# Patient Record
Sex: Male | Born: 1995
Health system: Southern US, Community
[De-identification: ages and names within clinical notes are randomized; demographics above are authoritative.]

## PROBLEM LIST (undated history)

## (undated) DIAGNOSIS — Z00129 Encounter for routine child health examination without abnormal findings: Secondary | ICD-10-CM

## (undated) DIAGNOSIS — F419 Anxiety disorder, unspecified: Secondary | ICD-10-CM

## (undated) DIAGNOSIS — Z003 Encounter for examination for adolescent development state: Secondary | ICD-10-CM

## (undated) HISTORY — PX: MOLE REMOVAL: SHX2046

## (undated) HISTORY — DX: Anxiety disorder, unspecified: F41.9

## (undated) HISTORY — DX: Encounter for examination for adolescent development state: Z00.3

## (undated) HISTORY — DX: Encounter for routine child health examination without abnormal findings: Z00.129

## (undated) HISTORY — PX: HARDWARE REMOVAL: SHX979

---
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
## 2016-03-02 DIAGNOSIS — L309 Dermatitis, unspecified: Secondary | ICD-10-CM | POA: Diagnosis not present

## 2017-02-26 ENCOUNTER — Encounter: Payer: Self-pay | Admitting: Family Medicine

## 2017-02-26 ENCOUNTER — Ambulatory Visit (INDEPENDENT_AMBULATORY_CARE_PROVIDER_SITE_OTHER): Payer: Federal, State, Local not specified - PPO | Admitting: Family Medicine

## 2017-02-26 VITALS — BP 110/84 | HR 80 | Temp 98.7°F | Ht 71.25 in | Wt 153.2 lb

## 2017-02-26 DIAGNOSIS — R17 Unspecified jaundice: Secondary | ICD-10-CM | POA: Diagnosis not present

## 2017-02-26 DIAGNOSIS — Z13220 Encounter for screening for lipoid disorders: Secondary | ICD-10-CM

## 2017-02-26 DIAGNOSIS — Z0000 Encounter for general adult medical examination without abnormal findings: Secondary | ICD-10-CM | POA: Diagnosis not present

## 2017-02-26 LAB — CBC
HEMATOCRIT: 46.9 % (ref 39.0–52.0)
HEMOGLOBIN: 16.2 g/dL (ref 13.0–17.0)
MCHC: 34.4 g/dL (ref 30.0–36.0)
MCV: 92.4 fl (ref 78.0–100.0)
PLATELETS: 284 10*3/uL (ref 150.0–400.0)
RBC: 5.08 Mil/uL (ref 4.22–5.81)
RDW: 13.7 % (ref 11.5–14.6)
WBC: 5.9 10*3/uL (ref 4.5–10.5)

## 2017-02-26 LAB — LIPID PANEL
CHOL/HDL RATIO: 2
CHOLESTEROL: 169 mg/dL (ref 0–200)
HDL: 70.3 mg/dL (ref >39.00)
LDL CALC: 76 mg/dL (ref 0–99)
NONHDL: 98.66
Triglycerides: 112 mg/dL (ref 0.0–149.0)
VLDL: 22.4 mg/dL (ref 0.0–40.0)

## 2017-02-26 LAB — COMPREHENSIVE METABOLIC PANEL
ALBUMIN: 4.8 g/dL (ref 3.5–5.2)
ALT: 28 U/L (ref 0–53)
AST: 29 U/L (ref 0–37)
Alkaline Phosphatase: 53 U/L (ref 39–117)
BUN: 8 mg/dL (ref 6–23)
CHLORIDE: 102 meq/L (ref 96–112)
CO2: 27 mEq/L (ref 19–32)
CREATININE: 0.74 mg/dL (ref 0.40–1.50)
Calcium: 9.8 mg/dL (ref 8.4–10.5)
GFR: 142.98 mL/min (ref >60.00)
Glucose, Bld: 86 mg/dL (ref 70–99)
POTASSIUM: 4.1 meq/L (ref 3.5–5.1)
SODIUM: 137 meq/L (ref 135–145)
Total Bilirubin: 1.1 mg/dL (ref 0.2–1.2)
Total Protein: 7.4 g/dL (ref 6.0–8.3)

## 2017-02-26 NOTE — Patient Instructions (Signed)
Please stop by lab before you go  No changes today  Would love no future concussions for your long term health
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
## 2017-08-06 HISTORY — PX: LEG SURGERY: SHX1003

## 2017-08-16 ENCOUNTER — Telehealth: Payer: Self-pay | Admitting: Family Medicine

## 2017-08-16 NOTE — Telephone Encounter (Signed)
Copied from CRM [id number]. Topic: General - Other >> Aug 16, 2017 12:49 PM [name], Stephen Martinez wrote: Reason for CRM: patient calling wanting someone to fax over his immunizations to [facility] fax number [phone] phone # [phone]

## 2017-08-16 NOTE — Telephone Encounter (Signed)
Pt notified copy of immunizations faxed to [employer] as requested. Pt verbalized understanding.

## 2017-08-16 NOTE — Telephone Encounter (Signed)
See note

## 2017-09-07 DIAGNOSIS — S82892A Other fracture of left lower leg, initial encounter for closed fracture: Secondary | ICD-10-CM | POA: Diagnosis not present

## 2017-09-07 DIAGNOSIS — W5529XA Other contact with cow, initial encounter: Secondary | ICD-10-CM | POA: Diagnosis not present

## 2017-09-07 DIAGNOSIS — S82402A Unspecified fracture of shaft of left fibula, initial encounter for closed fracture: Secondary | ICD-10-CM | POA: Diagnosis not present

## 2017-09-07 DIAGNOSIS — S82392A Other fracture of lower end of left tibia, initial encounter for closed fracture: Secondary | ICD-10-CM | POA: Diagnosis not present

## 2017-09-07 DIAGNOSIS — S82209A Unspecified fracture of shaft of unspecified tibia, initial encounter for closed fracture: Secondary | ICD-10-CM | POA: Insufficient documentation

## 2017-09-07 DIAGNOSIS — S82302A Unspecified fracture of lower end of left tibia, initial encounter for closed fracture: Secondary | ICD-10-CM | POA: Diagnosis not present

## 2017-09-07 DIAGNOSIS — S82442A Displaced spiral fracture of shaft of left fibula, initial encounter for closed fracture: Secondary | ICD-10-CM | POA: Diagnosis not present

## 2017-09-07 DIAGNOSIS — S82162A Torus fracture of upper end of left tibia, initial encounter for closed fracture: Secondary | ICD-10-CM | POA: Diagnosis not present

## 2017-09-07 DIAGNOSIS — S8992XA Unspecified injury of left lower leg, initial encounter: Secondary | ICD-10-CM | POA: Diagnosis not present

## 2017-09-07 DIAGNOSIS — Y999 Unspecified external cause status: Secondary | ICD-10-CM | POA: Diagnosis not present

## 2017-09-07 DIAGNOSIS — S82242A Displaced spiral fracture of shaft of left tibia, initial encounter for closed fracture: Secondary | ICD-10-CM | POA: Diagnosis not present

## 2017-09-08 DIAGNOSIS — S82432A Displaced oblique fracture of shaft of left fibula, initial encounter for closed fracture: Secondary | ICD-10-CM | POA: Diagnosis not present

## 2017-09-08 DIAGNOSIS — S82892A Other fracture of left lower leg, initial encounter for closed fracture: Secondary | ICD-10-CM | POA: Diagnosis not present

## 2017-09-08 DIAGNOSIS — S82392A Other fracture of lower end of left tibia, initial encounter for closed fracture: Secondary | ICD-10-CM | POA: Diagnosis not present

## 2017-09-08 DIAGNOSIS — S82832A Other fracture of upper and lower end of left fibula, initial encounter for closed fracture: Secondary | ICD-10-CM | POA: Diagnosis not present

## 2017-09-08 DIAGNOSIS — S82202A Unspecified fracture of shaft of left tibia, initial encounter for closed fracture: Secondary | ICD-10-CM | POA: Diagnosis not present

## 2017-09-08 DIAGNOSIS — S82242A Displaced spiral fracture of shaft of left tibia, initial encounter for closed fracture: Secondary | ICD-10-CM | POA: Diagnosis not present

## 2017-09-08 DIAGNOSIS — S82302A Unspecified fracture of lower end of left tibia, initial encounter for closed fracture: Secondary | ICD-10-CM | POA: Diagnosis not present

## 2017-09-11 DIAGNOSIS — R269 Unspecified abnormalities of gait and mobility: Secondary | ICD-10-CM | POA: Diagnosis not present

## 2017-09-11 DIAGNOSIS — M6281 Muscle weakness (generalized): Secondary | ICD-10-CM | POA: Diagnosis not present

## 2017-10-22 DIAGNOSIS — Z9889 Other specified postprocedural states: Secondary | ICD-10-CM | POA: Diagnosis not present

## 2017-10-22 DIAGNOSIS — X58XXXD Exposure to other specified factors, subsequent encounter: Secondary | ICD-10-CM | POA: Diagnosis not present

## 2017-10-22 DIAGNOSIS — S82242D Displaced spiral fracture of shaft of left tibia, subsequent encounter for closed fracture with routine healing: Secondary | ICD-10-CM | POA: Diagnosis not present

## 2017-10-29 DIAGNOSIS — X58XXXD Exposure to other specified factors, subsequent encounter: Secondary | ICD-10-CM | POA: Diagnosis not present

## 2017-10-29 DIAGNOSIS — R262 Difficulty in walking, not elsewhere classified: Secondary | ICD-10-CM | POA: Diagnosis not present

## 2017-10-29 DIAGNOSIS — S82242D Displaced spiral fracture of shaft of left tibia, subsequent encounter for closed fracture with routine healing: Secondary | ICD-10-CM | POA: Diagnosis not present

## 2017-11-08 DIAGNOSIS — M25672 Stiffness of left ankle, not elsewhere classified: Secondary | ICD-10-CM | POA: Diagnosis not present

## 2017-11-08 DIAGNOSIS — R531 Weakness: Secondary | ICD-10-CM | POA: Diagnosis not present

## 2017-11-08 DIAGNOSIS — R262 Difficulty in walking, not elsewhere classified: Secondary | ICD-10-CM | POA: Diagnosis not present

## 2017-11-11 DIAGNOSIS — R262 Difficulty in walking, not elsewhere classified: Secondary | ICD-10-CM | POA: Diagnosis not present

## 2017-11-11 DIAGNOSIS — R531 Weakness: Secondary | ICD-10-CM | POA: Diagnosis not present

## 2017-11-11 DIAGNOSIS — M25672 Stiffness of left ankle, not elsewhere classified: Secondary | ICD-10-CM | POA: Diagnosis not present

## 2017-11-13 DIAGNOSIS — R262 Difficulty in walking, not elsewhere classified: Secondary | ICD-10-CM | POA: Diagnosis not present

## 2017-11-13 DIAGNOSIS — R531 Weakness: Secondary | ICD-10-CM | POA: Diagnosis not present

## 2017-11-13 DIAGNOSIS — M25672 Stiffness of left ankle, not elsewhere classified: Secondary | ICD-10-CM | POA: Diagnosis not present

## 2017-11-26 DIAGNOSIS — Z967 Presence of other bone and tendon implants: Secondary | ICD-10-CM | POA: Diagnosis not present

## 2017-11-26 DIAGNOSIS — X58XXXD Exposure to other specified factors, subsequent encounter: Secondary | ICD-10-CM | POA: Diagnosis not present

## 2017-11-26 DIAGNOSIS — S8252XD Displaced fracture of medial malleolus of left tibia, subsequent encounter for closed fracture with routine healing: Secondary | ICD-10-CM | POA: Diagnosis not present

## 2017-11-26 DIAGNOSIS — Z4789 Encounter for other orthopedic aftercare: Secondary | ICD-10-CM | POA: Diagnosis not present

## 2017-11-26 DIAGNOSIS — S82242D Displaced spiral fracture of shaft of left tibia, subsequent encounter for closed fracture with routine healing: Secondary | ICD-10-CM | POA: Diagnosis not present

## 2017-12-16 DIAGNOSIS — L821 Other seborrheic keratosis: Secondary | ICD-10-CM | POA: Diagnosis not present

## 2017-12-16 DIAGNOSIS — L814 Other melanin hyperpigmentation: Secondary | ICD-10-CM | POA: Diagnosis not present

## 2017-12-16 DIAGNOSIS — D485 Neoplasm of uncertain behavior of skin: Secondary | ICD-10-CM | POA: Diagnosis not present

## 2017-12-16 DIAGNOSIS — L74510 Primary focal hyperhidrosis, axilla: Secondary | ICD-10-CM | POA: Diagnosis not present

## 2017-12-16 DIAGNOSIS — D225 Melanocytic nevi of trunk: Secondary | ICD-10-CM | POA: Diagnosis not present

## 2017-12-16 DIAGNOSIS — D229 Melanocytic nevi, unspecified: Secondary | ICD-10-CM | POA: Diagnosis not present

## 2018-03-04 ENCOUNTER — Encounter: Payer: Federal, State, Local not specified - PPO | Admitting: Family Medicine

## 2018-03-11 DIAGNOSIS — T8484XA Pain due to internal orthopedic prosthetic devices, implants and grafts, initial encounter: Secondary | ICD-10-CM | POA: Diagnosis not present

## 2018-03-11 DIAGNOSIS — T8484XD Pain due to internal orthopedic prosthetic devices, implants and grafts, subsequent encounter: Secondary | ICD-10-CM | POA: Diagnosis not present

## 2018-03-11 DIAGNOSIS — X58XXXD Exposure to other specified factors, subsequent encounter: Secondary | ICD-10-CM | POA: Diagnosis not present

## 2018-03-11 DIAGNOSIS — Z4789 Encounter for other orthopedic aftercare: Secondary | ICD-10-CM | POA: Diagnosis not present

## 2018-03-11 DIAGNOSIS — S82242D Displaced spiral fracture of shaft of left tibia, subsequent encounter for closed fracture with routine healing: Secondary | ICD-10-CM | POA: Diagnosis not present

## 2018-03-11 DIAGNOSIS — S82302D Unspecified fracture of lower end of left tibia, subsequent encounter for closed fracture with routine healing: Secondary | ICD-10-CM | POA: Diagnosis not present

## 2018-04-02 DIAGNOSIS — Y831 Surgical operation with implant of artificial internal device as the cause of abnormal reaction of the patient, or of later complication, without mention of misadventure at the time of the procedure: Secondary | ICD-10-CM | POA: Diagnosis not present

## 2018-04-02 DIAGNOSIS — T8484XA Pain due to internal orthopedic prosthetic devices, implants and grafts, initial encounter: Secondary | ICD-10-CM | POA: Diagnosis not present

## 2018-04-02 DIAGNOSIS — Z472 Encounter for removal of internal fixation device: Secondary | ICD-10-CM | POA: Diagnosis not present

## 2018-04-02 DIAGNOSIS — S82242D Displaced spiral fracture of shaft of left tibia, subsequent encounter for closed fracture with routine healing: Secondary | ICD-10-CM | POA: Diagnosis not present

## 2018-04-02 DIAGNOSIS — S82202D Unspecified fracture of shaft of left tibia, subsequent encounter for closed fracture with routine healing: Secondary | ICD-10-CM | POA: Diagnosis not present

## 2018-04-02 DIAGNOSIS — S9305XD Dislocation of left ankle joint, subsequent encounter: Secondary | ICD-10-CM | POA: Diagnosis not present

## 2018-04-15 ENCOUNTER — Encounter: Payer: Federal, State, Local not specified - PPO | Admitting: Family Medicine

## 2018-04-23 DIAGNOSIS — R1011 Right upper quadrant pain: Secondary | ICD-10-CM | POA: Diagnosis not present

## 2018-04-25 DIAGNOSIS — R1011 Right upper quadrant pain: Secondary | ICD-10-CM | POA: Diagnosis not present

## 2018-04-25 DIAGNOSIS — R945 Abnormal results of liver function studies: Secondary | ICD-10-CM | POA: Diagnosis not present

## 2018-04-25 DIAGNOSIS — R7989 Other specified abnormal findings of blood chemistry: Secondary | ICD-10-CM | POA: Diagnosis not present

## 2018-04-30 DIAGNOSIS — R1011 Right upper quadrant pain: Secondary | ICD-10-CM | POA: Diagnosis not present

## 2018-04-30 DIAGNOSIS — R945 Abnormal results of liver function studies: Secondary | ICD-10-CM | POA: Diagnosis not present

## 2018-04-30 LAB — HEPATIC FUNCTION PANEL
ALK PHOS: 71 (ref 25–125)
ALT: 47 — AB (ref 10–40)
AST: 46 — AB (ref 14–40)
BILIRUBIN, TOTAL: 7.8

## 2018-04-30 LAB — HM HEPATITIS C SCREENING LAB: HM Hepatitis Screen: NEGATIVE

## 2018-05-06 ENCOUNTER — Encounter: Payer: Self-pay | Admitting: Family Medicine

## 2018-05-30 ENCOUNTER — Encounter: Payer: Self-pay | Admitting: Family Medicine

## 2018-05-30 ENCOUNTER — Ambulatory Visit (INDEPENDENT_AMBULATORY_CARE_PROVIDER_SITE_OTHER): Payer: Federal, State, Local not specified - PPO | Admitting: Family Medicine

## 2018-05-30 VITALS — BP 114/78 | HR 89 | Temp 98.5°F | Ht 71.25 in | Wt 162.2 lb

## 2018-05-30 DIAGNOSIS — Z23 Encounter for immunization: Secondary | ICD-10-CM | POA: Diagnosis not present

## 2018-05-30 DIAGNOSIS — Z0000 Encounter for general adult medical examination without abnormal findings: Secondary | ICD-10-CM | POA: Diagnosis not present
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
## 2018-06-12 ENCOUNTER — Ambulatory Visit: Payer: Self-pay | Admitting: *Deleted

## 2018-06-12 NOTE — Telephone Encounter (Signed)
Called patient reviewed all symptoms with him. States that it is tightness that changes in level. He feels more at rest then when he is active. Pain level is about a  3-4 right now.  In the past it happened during very stressful events. He denies any arm or jaw pain. No SOB with activity. Does have some cough for the last few day. No N&V. No Dizziness or changes in vision.  He does not want to go to ED. Advised that if any changes in symptoms or increase in pain that he needs to go to ED or call 911. He is coming back to town tonight and have made app for him to see [facility] tomorrow. I have spoke with mom as well and advised of plan. She states that he has had some issues with stress in the past and she feels that is what the issue is now. Patient also states that he has no issues with the pain with his daily workouts.

## 2018-06-12 NOTE — Telephone Encounter (Signed)
Patient is calling to report he has had chest pain on the left at his heart for 24 hours- he can not make it stop.Chest pain has happened twice when he was stressed and the pain moved and went away. Patient states this time it has not moved or gone away.9/25 and 2 weeks ago- other episodes. 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 He wants to call his mother and see if she wants him to get check there or closer to home- advised him about the dangers of driving.  Reason for Disposition . SEVERE chest pain  Answer Assessment - Initial Assessment Questions 1. LOCATION: "Where does it hurt?"       Chest pain to the left- feels like someone is squeezing heart 2. RADIATION: "Does the pain go anywhere else?" (e.g., into neck, jaw, arms, back)     To the back when bad 3. ONSET: "When did the chest pain begin?" (Minutes, hours or days)      Started yesterday am 4. PATTERN "Does the pain come and go, or has it been constant since it started?"  "Does it get worse with exertion?"      Constant since started- not worse with exertion 5. DURATION: "How long does it last" (e.g., seconds, minutes, hours)     24 hours 6. SEVERITY: "How bad is the pain?"  (e.g., Scale 1-10; mild, moderate, or severe)    - MILD (1-3): doesn't interfere with normal activities     - MODERATE (4-7): interferes with normal activities or awakens from sleep    - SEVERE (8-10): excruciating pain, unable to do any normal activities       5- more an uncomfortable pain 7. CARDIAC RISK FACTORS: "Do you have any history of heart problems or risk factors for heart disease?" (e.g., prior heart attack, angina; high blood pressure, diabetes, being overweight, high cholesterol, smoking, or strong family history of heart disease)     Family hx 8. PULMONARY RISK FACTORS: "Do you have any history of lung disease?"  (e.g., blood clots in lung, asthma, emphysema,  birth control pills)     no 9. CAUSE: "What do you think is causing the chest pain?"     Stress and anxiety history- patient can't make this go away 10. OTHER SYMPTOMS: "Do you have any other symptoms?" (e.g., dizziness, nausea, vomiting, sweating, fever, difficulty breathing, cough)       Sometimes hard to breath- chest feels tight 11. PREGNANCY: "Is there any chance you are pregnant?" "When was your last menstrual period?"       n/a  Protocols used: CHEST PAIN-A-AH

## 2018-06-13 ENCOUNTER — Ambulatory Visit (INDEPENDENT_AMBULATORY_CARE_PROVIDER_SITE_OTHER): Payer: Federal, State, Local not specified - PPO | Admitting: Physician Assistant

## 2018-06-13 ENCOUNTER — Ambulatory Visit (INDEPENDENT_AMBULATORY_CARE_PROVIDER_SITE_OTHER): Payer: Federal, State, Local not specified - PPO

## 2018-06-13 ENCOUNTER — Encounter: Payer: Self-pay | Admitting: Physician Assistant

## 2018-06-13 VITALS — BP 126/86 | HR 83 | Temp 99.1°F | Ht 71.25 in | Wt 161.2 lb

## 2018-06-13 DIAGNOSIS — R0602 Shortness of breath: Secondary | ICD-10-CM | POA: Diagnosis not present

## 2018-06-13 DIAGNOSIS — R079 Chest pain, unspecified: Secondary | ICD-10-CM

## 2018-06-13 NOTE — Progress Notes (Signed)
Stephen Martinez is a 22 y.o. male here for a new problem.  I acted as a Neurosurgeon for [employer], PA-C Corky Mull, LPN  History of Present Illness:   Chief Complaint  Patient presents with  . Chest Pain    Chest Pain   This is a new problem. Episode onset: Started 2 days ago. The onset quality is sudden. The problem occurs intermittently. The problem has been waxing and waning. Pain location: Left chest area. The pain is at a severity of 4/10. The pain is mild. The quality of the pain is described as crushing and squeezing. The pain radiates to the left shoulder. Associated symptoms include shortness of breath. Pertinent negatives include no dizziness, fever, headaches, irregular heartbeat, lower extremity edema, nausea, near-syncope, palpitations, syncope or vomiting. He has tried rest and NSAIDs for the symptoms. The treatment provided no relief. Risk factors include male gender.   1 month ago had an episode where his heart was "squeezing" while he was driving but it went away on its own.   Symptoms can sometimes take his breath away and he begins to "over think" his breathing -- and is supposedly hyperventilating causing worsening symptoms. Symptoms completely go away when his heart rate is increased -- such as doing cardio or "10 miles" on the treadmill. Had a huge work-up for his liver in September 2019 and states that since this time he has had significant health anxiety.  States that he has been sleeping well. No hx of asthma. No recent travel. Drinks anywhere from 1 cup of black coffee to 6 cups a day. Has had three cups of coffee today. Used to drink red bulls, doesn't anymore.  Does vape, has a Juul. Does not drink excessive alcohol or do any illegal drugs.   GAD 7 : Generalized Anxiety Score 06/13/2018  Nervous, Anxious, on Edge 1  Control/stop worrying 1  Worry too much - different things 1  Trouble relaxing 1  Restless 0  Easily annoyed or irritable 1  Afraid - awful  might happen 3  Total GAD 7 Score 8  Anxiety Difficulty Not difficult at all     Past Medical History:  Diagnosis Date  . Healthy adolescent on routine physical examination      Social History   Socioeconomic History  . Marital status: Single    Spouse name: Not on file  . Number of children: Not on file  . Years of education: Not on file  . Highest education level: Not on file  Occupational History  . Not on file  Social Needs  . Financial resource strain: Not on file  . Food insecurity:    Worry: Not on file    Inability: Not on file  . Transportation needs:    Medical: Not on file    Non-medical: Not on file  Tobacco Use  . Smoking status: Never Smoker  . Smokeless tobacco: Current User    Types: Chew  Substance and Sexual Activity  . Alcohol use: No  . Drug use: No  . Sexual activity: Never  Lifestyle  . Physical activity:    Days per week: Not on file    Minutes per session: Not on file  . Stress: Not on file  Relationships  . Social connections:    Talks on phone: Not on file    Gets together: Not on file    Attends religious service: Not on file    Active member of club or organization: Not on  file    Attends meetings of clubs or organizations: Not on file    Relationship status: Not on file  . Intimate partner violence:    Fear of current or ex partner: Not on file    Emotionally abused: Not on file    Physically abused: Not on file    Forced sexual activity: Not on file  Other Topics Concern  . Not on file  Social History Narrative   Lived in [place] whole life   Lives with Mom primarily and step dad and at times step dad. Sister also lives there.       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 GF in [place] (7 months as of 03/2014). Formerly went to [employer] but does not anymore. No current church but enjoys praying.       Pets: 1 dog sheltie.       Formerly at [employer].     Past Surgical History:  Procedure Laterality Date  . MOLE REMOVAL     benign at age 27 or 18 and age 62    Family History  Problem Relation Age of Onset  . Other Sister        dysautonomia/Pott's    No Known Allergies  Current Medications:  No current outpatient medications on file.   Review of Systems:   Review of Systems  Constitutional: Negative for fever.  Respiratory: Positive for shortness of breath.   Cardiovascular: Positive for chest pain. Negative for palpitations, syncope and near-syncope.  Gastrointestinal: Negative for nausea and vomiting.  Neurological: Negative for dizziness and headaches.    Vitals:   Vitals:   06/13/18 1427  BP: 126/86  Pulse: 83  Temp: 99.1 F (37.3 C)  TempSrc: Oral  SpO2: 96%  Weight: 161 lb 4 oz (73.1 kg)  Height: 5' 11.25" (1.81 m)     Body mass index is 22.33 kg/m.  Physical Exam:   Physical Exam  Constitutional: He appears well-developed. He is cooperative.  Non-toxic appearance. He does not have a sickly appearance. He does not appear ill. No distress.  Cardiovascular: Normal rate, regular rhythm, S1 normal, S2 normal, normal heart sounds and normal pulses.  No LE edema  Pulmonary/Chest: Effort normal and breath sounds normal.  Neurological: He is alert. GCS eye subscore is 4. GCS verbal subscore is 5. GCS motor subscore is 6.  Skin: Skin is warm, dry and intact.  Psychiatric: He has a normal mood and affect. His speech is normal and behavior is normal.  Nursing note and vitals reviewed.  EKG tracing is personally reviewed.  EKG notes sinus arrhythmia.  No acute changes.   Assessment and Plan:    Sha was seen today for chest pain.  Diagnoses and all orders for this  visit:  Chest pain, unspecified type -     EKG 12-Lead -     CBC with Differential/Platelet -     Comprehensive metabolic panel -     TSH -     DG Chest 2 View; Future   No red flags on exam. EKG is essentially normal. Chest xray pending. Labs pending. No exertional component  to his symptoms, low suspicion for ACS. Reviewed and provided ER precautions. Follow-up with PCP or myself if symptoms persist. Recommended limiting caffeine and abstaining from vaping if possible.   . Reviewed expectations re: course of current medical issues. . Discussed self-management of symptoms. . Outlined signs and symptoms indicating need for more acute intervention. . Patient verbalized understanding and all questions were answered. . See orders for this visit as documented in the electronic medical record. . Patient received an After-Visit Summary.  CMA or LPN served as scribe during this visit. History, Physical, and Plan performed by medical provider. The above documentation has been reviewed and is accurate and complete.  Jarold Motto, PA-C

## 2018-06-13 NOTE — Patient Instructions (Signed)
It was great to see you!  Please avoid vaping and excessive caffeine over the weekend to see if this helps your symptoms.  Please reach out to [place] if you have any concerns. If you feel as though your anxiety is worsening, we can discuss treatment options -- please reach out!  Take care,  Jarold Motto PA-C  Contact a doctor if:  Your chest pain does not go away.  You have a rash with blisters on your chest.  You have a fever.  You have chills. Get help right away if:  Your chest pain is worse.  You have a cough that gets worse, or you cough up blood.  You have very bad (severe) pain in your belly (abdomen).  You are very weak.  You pass out (faint).  You have either of these for no clear reason: ? Sudden chest discomfort. ? Sudden discomfort in your arms, back, neck, or jaw.  You have shortness of breath at any time.  You suddenly start to sweat, or your skin gets clammy.  You feel sick to your stomach (nauseous).  You throw up (vomit).  You suddenly feel light-headed or dizzy.  Your heart starts to beat fast, or it feels like it is skipping beats. These symptoms may be an emergency. Do not wait to see if the symptoms will go away. Get medical help right away. Call your local emergency services (911 in the [place] Do not drive yourself to the hospital.

## 2018-06-14 LAB — CBC WITH DIFFERENTIAL/PLATELET
BASOS ABS: 77 {cells}/uL (ref 0–200)
Basophils Relative: 1.3 %
EOS ABS: 171 {cells}/uL (ref 15–500)
Eosinophils Relative: 2.9 %
HEMATOCRIT: 42.6 % (ref 38.5–50.0)
HEMOGLOBIN: 14.7 g/dL (ref 13.2–17.1)
LYMPHS ABS: 1947 {cells}/uL (ref 850–3900)
MCH: 30.8 pg (ref 27.0–33.0)
MCHC: 34.5 g/dL (ref 32.0–36.0)
MCV: 89.1 fL (ref 80.0–100.0)
MPV: 9.9 fL (ref 7.5–12.5)
Monocytes Relative: 10.1 %
NEUTROS ABS: 3109 {cells}/uL (ref 1500–7800)
Neutrophils Relative %: 52.7 %
Platelets: 275 10*3/uL (ref 140–400)
RBC: 4.78 10*6/uL (ref 4.20–5.80)
RDW: 12.3 % (ref 11.0–15.0)
Total Lymphocyte: 33 %
WBC mixed population: 596 cells/uL (ref 200–950)
WBC: 5.9 10*3/uL (ref 3.8–10.8)

## 2018-06-14 LAB — COMPREHENSIVE METABOLIC PANEL
AG Ratio: 1.8 (calc) (ref 1.0–2.5)
ALKALINE PHOSPHATASE (APISO): 50 U/L (ref 40–115)
ALT: 23 U/L (ref 9–46)
AST: 31 U/L (ref 10–40)
Albumin: 4.9 g/dL (ref 3.6–5.1)
BILIRUBIN TOTAL: 1.8 mg/dL — AB (ref 0.2–1.2)
BUN: 7 mg/dL (ref 7–25)
CALCIUM: 9.6 mg/dL (ref 8.6–10.3)
CO2: 24 mmol/L (ref 20–32)
Chloride: 104 mmol/L (ref 98–110)
Creat: 0.7 mg/dL (ref 0.60–1.35)
Globulin: 2.7 g/dL (calc) (ref 1.9–3.7)
Glucose, Bld: 69 mg/dL (ref 65–99)
Potassium: 4.4 mmol/L (ref 3.5–5.3)
Sodium: 140 mmol/L (ref 135–146)
Total Protein: 7.6 g/dL (ref 6.1–8.1)

## 2018-06-14 LAB — TSH: TSH: 4.17 m[IU]/L (ref 0.40–4.50)

## 2018-10-01 DIAGNOSIS — Z6823 Body mass index (BMI) 23.0-23.9, adult: Secondary | ICD-10-CM | POA: Diagnosis not present

## 2018-10-01 DIAGNOSIS — R1084 Generalized abdominal pain: Secondary | ICD-10-CM | POA: Diagnosis not present

## 2018-10-02 DIAGNOSIS — Z1211 Encounter for screening for malignant neoplasm of colon: Secondary | ICD-10-CM | POA: Diagnosis not present

## 2019-03-31 IMAGING — DX DG CHEST 2V
2 series · 2 of 2 positions shown · non-contrast
Comparison: None.

CLINICAL DATA: Pt c/o CP x 2 days that he describes as a constant
"crushing and sqeezing" sensation. intermittent SOB. Pt states he
uses a vape pen and smokeless tobacco.

EXAM:
CHEST - 2 VIEW

[chest pa]
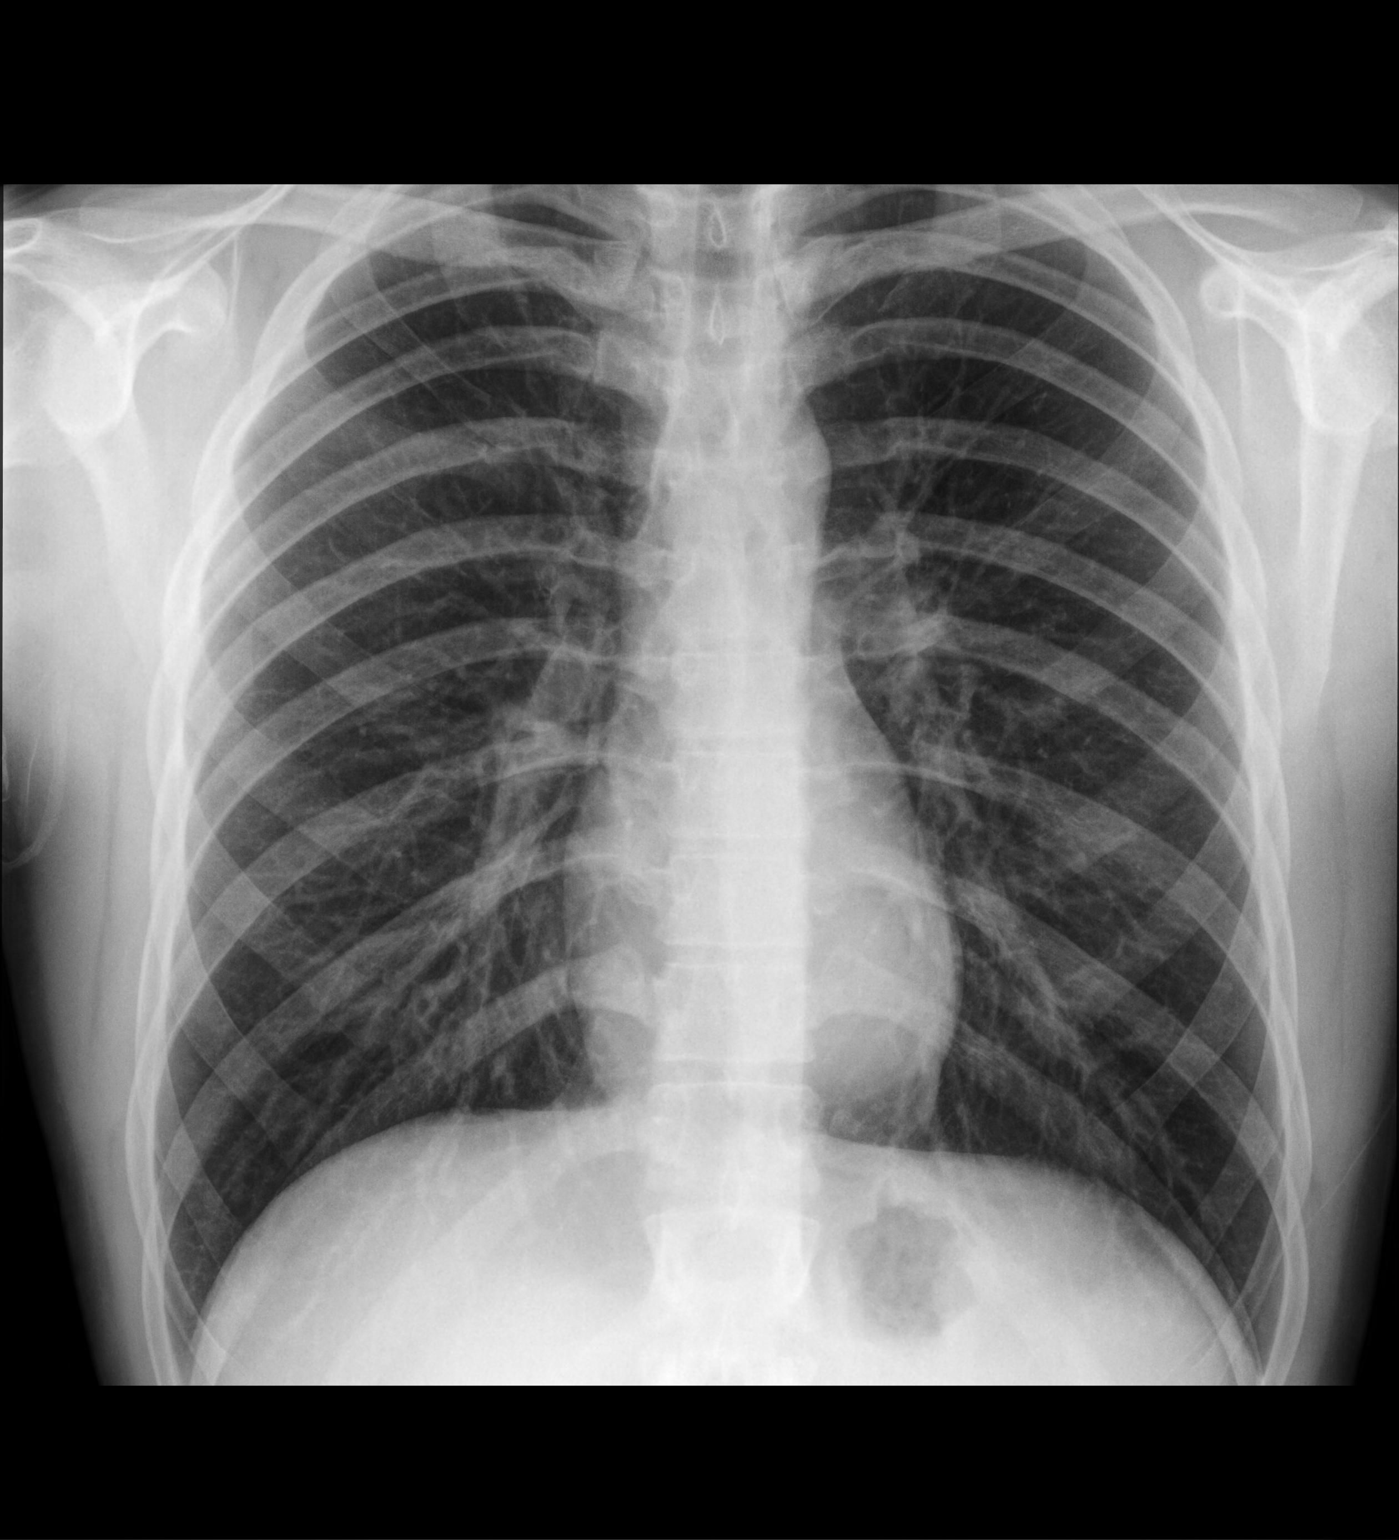

[chest lat]
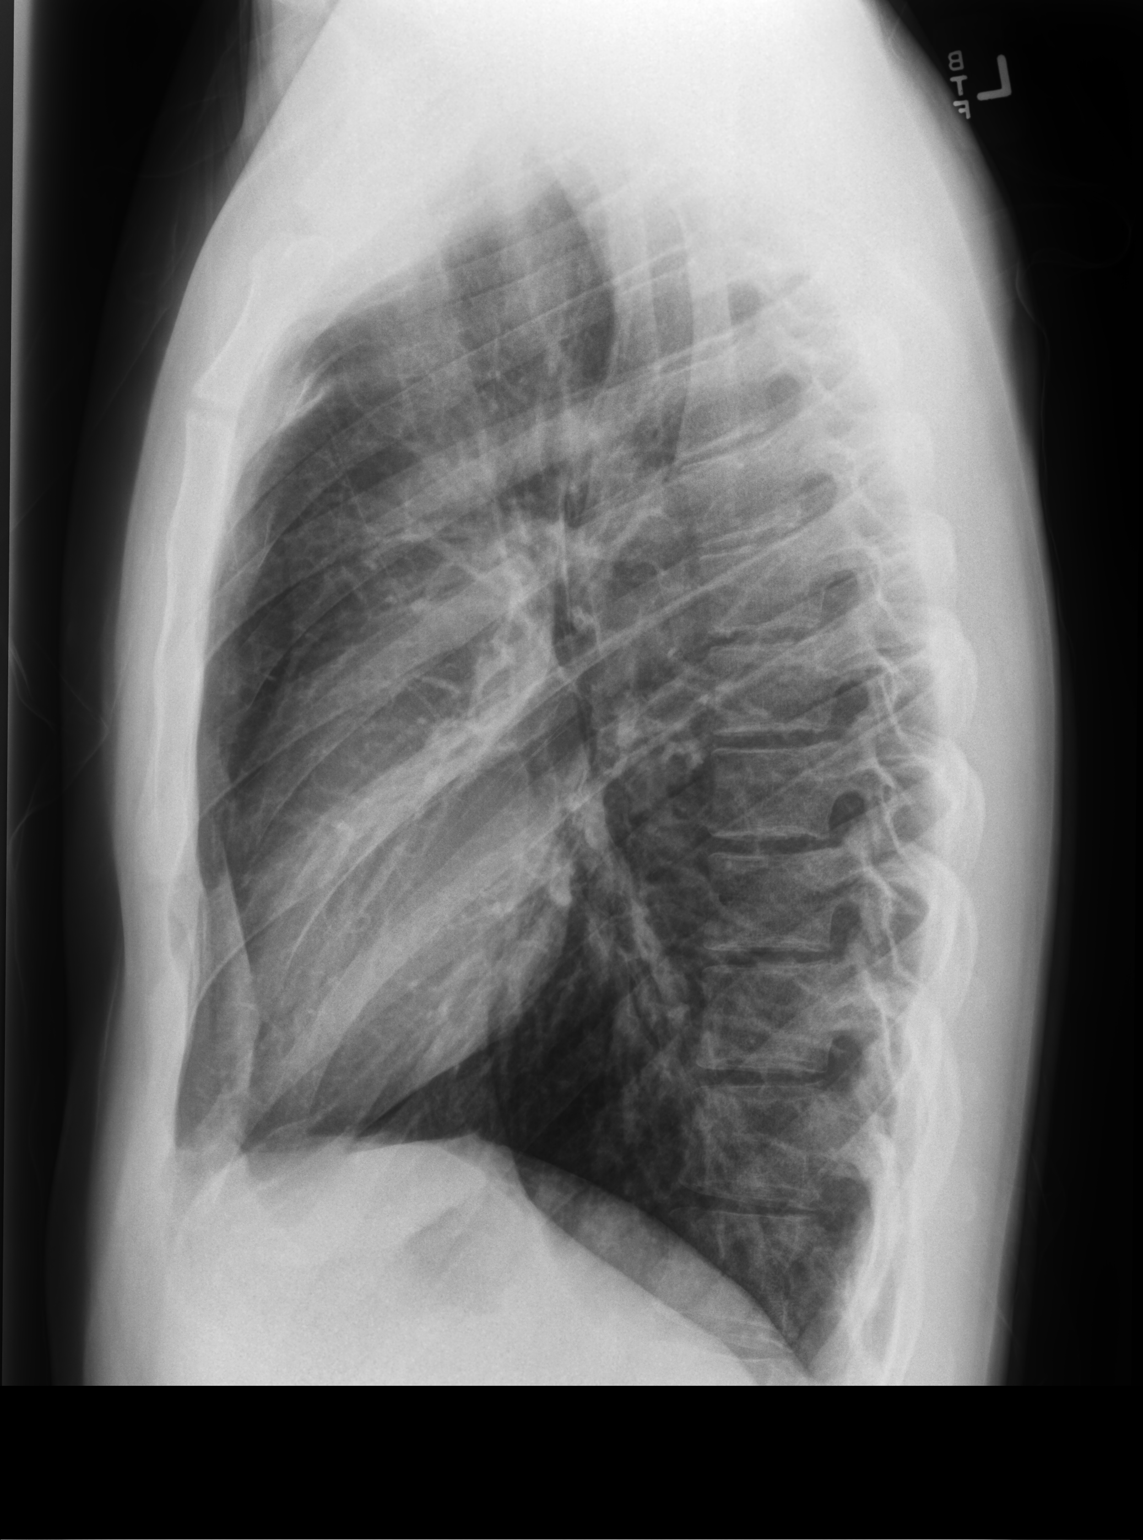

[2 of 2 positions shown; findings below may reference images not displayed]

FINDINGS: Normal heart, mediastinum and hila.

Lungs are clear.

No pleural effusion or pneumothorax.

The skeletal structures are unremarkable.
IMPRESSION: Normal chest radiographs.

## 2019-04-16 DIAGNOSIS — M9901 Segmental and somatic dysfunction of cervical region: Secondary | ICD-10-CM | POA: Diagnosis not present

## 2019-04-16 DIAGNOSIS — M5411 Radiculopathy, occipito-atlanto-axial region: Secondary | ICD-10-CM | POA: Diagnosis not present

## 2019-04-20 DIAGNOSIS — M9901 Segmental and somatic dysfunction of cervical region: Secondary | ICD-10-CM | POA: Diagnosis not present

## 2019-04-20 DIAGNOSIS — M5411 Radiculopathy, occipito-atlanto-axial region: Secondary | ICD-10-CM | POA: Diagnosis not present

## 2019-05-26 DIAGNOSIS — M5411 Radiculopathy, occipito-atlanto-axial region: Secondary | ICD-10-CM | POA: Diagnosis not present

## 2019-05-26 DIAGNOSIS — M9901 Segmental and somatic dysfunction of cervical region: Secondary | ICD-10-CM | POA: Diagnosis not present

## 2020-03-18 ENCOUNTER — Telehealth: Payer: Self-pay

## 2020-03-18 NOTE — Telephone Encounter (Signed)
Called mom not on dpr will have son call office to discuss with him.

## 2020-03-18 NOTE — Telephone Encounter (Signed)
Pt.'s mom called stating her son just had his bp Stephen Martinez at his covid vaccination . It was 148/94. Pt states he doesn't feel well. Mom is very concerned and would like him to be seen today if possible. Please Stephen Martinez

## 2020-03-18 NOTE — Telephone Encounter (Signed)
Sounds good-I agree with blood pressure rechecks since he is feeling better

## 2020-03-18 NOTE — Telephone Encounter (Signed)
FYI  Patent called back states that he is feeling ok now. Thinks it is just anxiety related to getting the vaccination. He will check his  blood pressures at home for a few days and let our office know. Reviewed red words that he would need to get evaluated at ED

## 2020-05-20 ENCOUNTER — Telehealth: Payer: Self-pay

## 2020-05-20 NOTE — Telephone Encounter (Signed)
Pt states he is still having gastro issues like he did 2 years ago. He was referred to a GI provider, but he got no answers. He is having issues in his RUQ. He is asking if he can be referred to GI , so he can get more tests ran.

## 2020-05-20 NOTE — Telephone Encounter (Signed)
LVM for pt to schedule appt 

## 2020-05-20 NOTE — Telephone Encounter (Signed)
If he is okay with it I would start with a visit with our office-can use same-day slot

## 2020-05-23 ENCOUNTER — Encounter: Payer: Self-pay | Admitting: Family Medicine

## 2020-05-23 ENCOUNTER — Telehealth (INDEPENDENT_AMBULATORY_CARE_PROVIDER_SITE_OTHER): Payer: Federal, State, Local not specified - PPO | Admitting: Family Medicine

## 2020-05-23 DIAGNOSIS — R1011 Right upper quadrant pain: Secondary | ICD-10-CM | POA: Diagnosis not present

## 2020-05-23 NOTE — Patient Instructions (Signed)
Health Maintenance Due  Topic Date Due  . HIV Screening  Never done  . TETANUS/TDAP  02/23/2019  . INFLUENZA VACCINE  03/06/2020    Depression screen [facility] 2/9 06/13/2018 06/23/2014 05/24/2014  Decreased Interest 0 0 0  Down, Depressed, Hopeless 0 1 2  PHQ - 2 Score 0 1 2  Altered sleeping 2 0 0  Tired, decreased energy 2 1 2   Change in appetite 0 0 0  Feeling bad or failure about yourself  0 1 0  Trouble concentrating 0 2 0  Moving slowly or fidgety/restless 0 0 2  Suicidal thoughts 0 0 0  PHQ-9 Score 4 5 6   Difficult doing work/chores Not difficult at all - -    Recommended follow up: No follow-ups on file.

## 2020-05-23 NOTE — Progress Notes (Deleted)
Duplicate note

## 2020-05-23 NOTE — Progress Notes (Signed)
Phone [phone] Virtual visit via Video note   Subjective:  Chief complaint: Chief Complaint  Patient presents with  . Discuss Referral   This visit type was conducted due to national recommendations for restrictions regarding the COVID-19 Pandemic (e.g. social distancing).  This format is felt to be most appropriate for this patient at this time balancing risks to patient and risks to population by having him in for in person visit.  No physical exam was performed (except for noted visual exam or audio findings with Telehealth visits).    Our team/I connected with Fonda Kinder at  1:40 PM EDT by a video enabled telemedicine application (doxy.me or caregility through epic) and verified that I am speaking with the correct person using two identifiers.  Location patient: Home[place] Location provider: [facility], office Persons participating in the virtual visit:  patient  Our team/I discussed the limitations of evaluation and management by telemedicine and the availability of in person appointments. In light of current covid-19 pandemic, patient also understands that we are trying to protect them by minimizing in office contact if at all possible.  The patient expressed consent for telemedicine visit and agreed to proceed. Patient understands insurance will be billed.   Past Medical History-  Patient Active Problem List   Diagnosis Date Noted  . Closed fracture of shaft of tibia 09/07/2017  . Depressed mood 05/24/2014    Medications- reviewed and updated No current outpatient medications on file.   No current facility-administered medications for this visit.     Objective:  no self reported vitals Gen: NAD, resting comfortably Lungs: nonlabored, normal respiratory rate  Skin: appears dry, no obvious rash     Assessment and Plan   #  Right upper quadrant pain S:05/20/18 at physical patient noted RUQ pain and went to urgent care. Later sent to to [facility] with [facility] [employer].  was eventually told he had GI bug. Also had bilirubin high- ultimately thought to be gilberts ultrasound gallbladder reportedly reassuring  He states RUQ pain never went away completely. States had 15 tubes of blood and fecal and urine tests. States essentially has had 2 years of pain. Pain worst at night for 20 minutes to an hour. Occasionally radiates to shoulder blade. Doesn't think type of food triggers it one way or the other. In mornings states perhaps slight cramp sensation. Ibuprofen helps some. Doesn't have tylenol at home.   Worn out by prior testing.  A/P: 24 year old male with now 2 years of right upper quadrant pain-about 2 years ago had extensive work-up with [facility] [employer]-reports reassuring blood work as well as right upper quadrant ultrasound.  Was thought to have Gilbert's syndrome.  We discussed repeating ultrasound and he is agreeable to this as well as coming by for labs (cbc, cmp, lipase)-he simply needs to call to schedule a lab appointment and can come by.  We also went ahead and refer to GI at his request-he should hear about this within 2 weeks.  If he has new or worsening symptoms to let us know.  Ideally would avoid ibuprofen if possible as gastric ulcers/intestinal ulcers can sometimes occur and that could cause recurrent pain in the upper quadrant on the right side. 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 RUQ pain  R10.11 Ambulatory referral to Gastroenterology    US Abdomen Limited RUQ (LIVER/GB)    CBC With Differential/Platelet    COMPLETE METABOLIC PANEL WITH GFR    Lipase   Time Spent: 14 minutes of total time (2:08 PM- 2:22 PM) was spent on the date of the encounter  performing the following actions: chart review prior to seeing the patient, obtaining history, performing a medically necessary exam, counseling on the treatment plan, placing orders, and documenting in our EHR.   Return precautions advised.  [name], MD

## 2020-05-24 ENCOUNTER — Other Ambulatory Visit: Payer: Federal, State, Local not specified - PPO

## 2020-05-24 ENCOUNTER — Other Ambulatory Visit: Payer: Self-pay

## 2020-05-24 ENCOUNTER — Encounter: Payer: Self-pay | Admitting: Family Medicine

## 2020-05-24 DIAGNOSIS — R1011 Right upper quadrant pain: Secondary | ICD-10-CM | POA: Diagnosis not present

## 2020-05-25 LAB — CBC WITH DIFFERENTIAL/PLATELET
Absolute Monocytes: 694 cells/uL (ref 200–950)
Basophils Absolute: 87 cells/uL (ref 0–200)
Basophils Relative: 1.4 %
Eosinophils Absolute: 217 cells/uL (ref 15–500)
Eosinophils Relative: 3.5 %
HCT: 42.4 % (ref 38.5–50.0)
Hemoglobin: 14.6 g/dL (ref 13.2–17.1)
Lymphs Abs: 2306 cells/uL (ref 850–3900)
MCH: 30.5 pg (ref 27.0–33.0)
MCHC: 34.4 g/dL (ref 32.0–36.0)
MCV: 88.5 fL (ref 80.0–100.0)
MPV: 9.8 fL (ref 7.5–12.5)
Monocytes Relative: 11.2 %
Neutro Abs: 2895 cells/uL (ref 1500–7800)
Neutrophils Relative %: 46.7 %
Platelets: 264 10*3/uL (ref 140–400)
RBC: 4.79 10*6/uL (ref 4.20–5.80)
RDW: 12.2 % (ref 11.0–15.0)
Total Lymphocyte: 37.2 %
WBC: 6.2 10*3/uL (ref 3.8–10.8)

## 2020-05-25 LAB — COMPLETE METABOLIC PANEL WITH GFR
AG Ratio: 1.8 (calc) (ref 1.0–2.5)
ALT: 17 U/L (ref 9–46)
AST: 19 U/L (ref 10–40)
Albumin: 4.8 g/dL (ref 3.6–5.1)
Alkaline phosphatase (APISO): 55 U/L (ref 36–130)
BUN: 8 mg/dL (ref 7–25)
CO2: 28 mmol/L (ref 20–32)
Calcium: 9.8 mg/dL (ref 8.6–10.3)
Chloride: 100 mmol/L (ref 98–110)
Creat: 0.72 mg/dL (ref 0.60–1.35)
GFR, Est African American: 152 mL/min/{1.73_m2} (ref >=60)
GFR, Est Non African American: 131 mL/min/{1.73_m2} (ref >=60)
Globulin: 2.7 g/dL (calc) (ref 1.9–3.7)
Glucose, Bld: 95 mg/dL (ref 65–99)
Potassium: 3.9 mmol/L (ref 3.5–5.3)
Sodium: 137 mmol/L (ref 135–146)
Total Bilirubin: 0.9 mg/dL (ref 0.2–1.2)
Total Protein: 7.5 g/dL (ref 6.1–8.1)

## 2020-05-25 LAB — LIPASE: Lipase: 23 U/L (ref 7–60)

## 2020-06-03 ENCOUNTER — Other Ambulatory Visit: Payer: Federal, State, Local not specified - PPO

## 2020-06-07 ENCOUNTER — Ambulatory Visit
Admission: RE | Admit: 2020-06-07 | Discharge: 2020-06-07 | Disposition: A | Discharge status: Home / Self Care | Payer: Federal, State, Local not specified - PPO | Source: Ambulatory Visit | Attending: Family Medicine | Admitting: Family Medicine

## 2020-06-07 DIAGNOSIS — R1011 Right upper quadrant pain: Secondary | ICD-10-CM

## 2020-06-07 DIAGNOSIS — K7689 Other specified diseases of liver: Secondary | ICD-10-CM | POA: Diagnosis not present

## 2020-06-08 ENCOUNTER — Other Ambulatory Visit: Payer: Self-pay

## 2020-06-08 DIAGNOSIS — R1011 Right upper quadrant pain: Secondary | ICD-10-CM

## 2020-06-09 ENCOUNTER — Encounter: Payer: Self-pay | Admitting: Family Medicine

## 2020-06-20 ENCOUNTER — Encounter: Payer: Self-pay | Admitting: Family Medicine

## 2020-08-11 ENCOUNTER — Encounter: Payer: Self-pay | Admitting: Gastroenterology

## 2020-08-11 ENCOUNTER — Ambulatory Visit (INDEPENDENT_AMBULATORY_CARE_PROVIDER_SITE_OTHER): Payer: Federal, State, Local not specified - PPO | Admitting: Gastroenterology

## 2020-08-11 VITALS — BP 110/78 | HR 83 | Ht 71.25 in | Wt 158.4 lb

## 2020-08-11 DIAGNOSIS — R1011 Right upper quadrant pain: Secondary | ICD-10-CM | POA: Diagnosis not present

## 2020-08-11 DIAGNOSIS — R933 Abnormal findings on diagnostic imaging of other parts of digestive tract: Secondary | ICD-10-CM | POA: Diagnosis not present

## 2020-08-11 NOTE — Progress Notes (Signed)
[facility] Consult Note:  History: Stephen Martinez 08/11/2020  Referring provider: Marin Olp, MD  Reason for consult/chief complaint: Abdominal Pain (X 2 years; patient notes that at the time pain started, he was drinking alcoholic beverages in excessive amounts. Since June 03, 2020, he has stopped all alcohol. Has noticed that his RUQ abdominal pain has subsided with the exception of when he needs to have a bowel movement.  Patient states that when he was having the pain, it felt like "runners stitch" under the right ribcage and could radiate to back, but also radiated down a bit. +gas -again, since d/c alcohol, this has improved.) and Constipation (Has had issues in the past with constipation but indicates this has improved some since d/c alcohol.)   Subjective  HPI:  This is a very pleasant 25 year old man referred by primary care for right upper quadrant pain.  It started about 2 years ago when he was in college, and says he was drinking somewhat heavily at times.  He had an evaluation at an urgent care in [place] with results noted below.  The pain would feel like a "stitch" in the right upper quadrant with associated bloating and gas and sometimes either diarrhea or constipation would also occur.  He denies nausea vomiting early satiety or loss of appetite. A few months back primary care recommended he stop alcohol altogether, and since doing so he feels much better.  The pain has resolved, he has been able to lose about 14 pounds after cutting out alcohol and says he has not felt this well in years.  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  ROS:  Review of Systems  Constitutional: Negative for appetite change and unexpected weight change.  HENT: Negative for mouth sores and voice change.   Eyes: Negative  for pain and redness.  Respiratory: Negative for cough and shortness of breath.   Cardiovascular: Negative for chest pain and palpitations.  Genitourinary: Negative for dysuria and hematuria.  Musculoskeletal: Negative for arthralgias and myalgias.  Skin: Negative for pallor and rash.  Neurological: Negative for weakness and headaches.  Hematological: Negative for adenopathy.     Past Medical History: Past Medical History:  Diagnosis Date  . Healthy adolescent on routine physical examination    No chronic medical problems  Past Surgical History: Past Surgical History:  Procedure Laterality Date  . HARDWARE REMOVAL     leg  . LEG SURGERY  2019  . MOLE REMOVAL     benign at age 89 or 76 and age 72     Family History: Family History  Problem Relation Age of Onset  . Other Sister        dysautonomia/Pott's  . Lung cancer Maternal Grandmother   . Breast cancer Maternal Grandmother   . Stomach cancer Maternal Grandfather   . Esophageal cancer Neg Hx   . Colon cancer Neg Hx   . Pancreatic cancer Neg Hx   . Liver disease Neg Hx     Social History: Social History   Socioeconomic History  . Marital status: Single    Spouse name: Not on file  . Number of children: Not on file  . Years of education: Not on file  . Highest education level: Not on file  Occupational History  . Not on file  Tobacco Use  .  Smoking status: Never Smoker  . Smokeless tobacco: Former Neurosurgeon    Types: Chew  . Tobacco comment: uses "on pouches"   Substance and Sexual Activity  . Alcohol use: No  . Drug use: No  . Sexual activity: Never  Other Topics Concern  . Not on file  Social History Narrative   Lived in [place] whole life   Lives with Mom primarily and step dad and at times step dad. Sister also lives there.          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 GF in [place] (7 months as of 03/2014). Formerly went to [employer] but does not anymore. No current church but enjoys praying.       Pets: 1 dog sheltie.       Formerly at [employer].    Social Determinants of Health   Financial Resource Strain: Not on file  Food Insecurity: Not on file  Transportation Needs: Not on file  Physical Activity: Not on file  Stress: Not on file  Social Connections: Not on file    Allergies: No Known Allergies  Outpatient Meds: Current Outpatient Medications  Medication Sig Dispense Refill  . AMBULATORY NON FORMULARY MEDICATION Medication Name: Liver Aid- 2 capsules daily    . Probiotic Product (PROBIOTIC PO) Take 1 capsule by mouth daily.     No current facility-administered medications for this visit.      ___________________________________________________________________ Objective   Exam:  BP 110/78   Pulse 83   Ht 5' 11.25" (1.81 m)   Wt 158 lb 6.4 oz (71.8 kg)   SpO2 99%   BMI 21.94 kg/m  Wt Readings from Last 3 Encounters:  08/11/20 158 lb 6.4 oz (71.8 kg)  06/13/18 161 lb 4 oz (73.1 kg)  05/30/18 162 lb 3.2 oz (73.6 kg)     General: He is well-appearing  Eyes: sclera anicteric, no redness  ENT: oral mucosa moist without lesions, no cervical or supraclavicular lymphadenopathy  CV: RRR without murmur, S1/S2, no JVD, no peripheral edema  Resp: clear to auscultation bilaterally, normal RR and effort noted  GI: soft, no tenderness, with active bowel sounds. No guarding or palpable organomegaly noted.  Skin; warm and dry, no rash or jaundice noted  Neuro: awake, alert and oriented x 3. Normal gross motor function and fluent speech  Labs:  CBC Latest Ref Rng & Units 05/24/2020 06/13/2018  02/26/2017  WBC 3.8 - 10.8 Thousand/uL 6.2 5.9 5.9  Hemoglobin 13.2 - 17.1 g/dL 11.9 14.7 82.9  Hematocrit 38.5 - 50.0 % 42.4 42.6 46.9  Platelets 140 - 400 Thousand/uL 264 275 284.0   CMP Latest Ref Rng & Units 05/24/2020 06/13/2018 04/30/2018  Glucose 65 - 99 mg/dL 95 69 -  BUN 7 - 25 mg/dL 8 7 -  Creatinine 5.62 - 1.35 mg/dL 1.30 8.65 -  Sodium 784 - 146 mmol/L 137 140 -  Potassium 3.5 - 5.3 mmol/L 3.9 4.4 -  Chloride 98 - 110 mmol/L 100 104 -  CO2 20 - 32 mmol/L 28 24 -  Calcium 8.6 -  10.3 mg/dL 9.8 9.6 -  Total Protein 6.1 - 8.1 g/dL 7.5 7.6 -  Total Bilirubin 0.2 - 1.2 mg/dL 0.9 2.5(D) -  Alkaline Phos 25 - 125 - - 71  AST 10 - 40 U/L 19 31 46(A)  ALT 9 - 46 U/L 17 23 47(A)   Hepatic function panel normal in July 2018  [place], [place]  Sept 2019, ASt/ALt mid 40s, T bili 2.0 (1.5 indirect) Neg acute viral hepatitis paneel GGT nml [place] normal ____________________________________________   Radiologic Studies:  CLINICAL DATA:  Initial evaluation for chronic right upper quadrant pain for 2 years.   EXAM: ULTRASOUND ABDOMEN LIMITED RIGHT UPPER QUADRANT   COMPARISON:  None.   FINDINGS: Gallbladder:   No gallstones or wall thickening visualized. No sonographic Murphy sign noted by sonographer.   Common bile duct:   Diameter: 2.6 mm   Liver:   No focal lesion identified. Liver demonstrates a mildly heterogeneous and echogenic echotexture. Portal vein is patent on color Doppler imaging with normal direction of blood flow towards the liver.   Other: None.   IMPRESSION: 1. Normal sonographic appearance of the gallbladder. No cholelithiasis or evidence for acute cholecystitis. 2. No biliary dilatation. 3. Mildly heterogeneous and echogenic echotexture of the liver, nonspecific, but can be seen in the setting of steatosis or other intrinsic hepatocellular disease. Correlation with LFTs suggested.     Electronically Signed   By: Rise Mu M.D.   On:  06/07/2020 23:44   Assessment: Encounter Diagnoses  Name Primary?  . RUQ pain Yes  . Abnormal finding on GI tract imaging    His pain is not from gallbladder or liver disease.  It sounds like increase intestinal gas/spasm related to excess alcohol intake and perhaps other dietary triggers.  He has made significant changes now feels quite well.  He was concerned about the radiologist's description of the hepatic echotexture, especially the suggestion of "intrinsic hepatocellular disease".  I reassured him that is a common and benign description, it may be mild fatty liver or more likely normal variant.  His LFTs are normal, indicating he is not likely to have underlying significant hepatocellular disease.  Plan:  Recommended continued healthy diet and lifestyle, modest if any alcohol intake and avoid gassy foods. No further testing necessary at this time.  He will see me as needed  Thank you for the courtesy of this consult.  Please call me with any questions or concerns.  Charlie Pitter III  CC: Referring provider noted above

## 2020-08-11 NOTE — Patient Instructions (Signed)
If you are age 24 or older, your body mass index should be between 23-30. Your Body mass index is 21.94 kg/m. If this is out of the aforementioned range listed, please consider follow up with your Primary Care Provider.  If you are age 17 or younger, your body mass index should be between 19-25. Your Body mass index is 21.94 kg/m. If this is out of the aformentioned range listed, please consider follow up with your Primary Care Provider.   Follow up as needed.   It was a pleasure to see you today!  Dr. Myrtie Neither
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
## 2020-09-26 NOTE — Patient Instructions (Addendum)
Let us know dates of covid vaccine  Health Maintenance Due  Topic Date Due  . TETANUS/TDAP - please call back for this or get at pharmacy and let us know 02/23/2019   Glad you are feeling better- lets still limit alcohol to 7 per week max  Recommended follow up: Return in about 1 year (around 09/27/2021) for physical or sooner if needed.

## 2020-09-27 ENCOUNTER — Other Ambulatory Visit: Payer: Self-pay

## 2020-09-27 ENCOUNTER — Encounter: Payer: Self-pay | Admitting: Family Medicine

## 2020-09-27 ENCOUNTER — Ambulatory Visit (INDEPENDENT_AMBULATORY_CARE_PROVIDER_SITE_OTHER): Payer: 59 | Admitting: Family Medicine

## 2020-09-27 VITALS — BP 132/77 | HR 77 | Temp 98.7°F | Ht 71.0 in | Wt 158.0 lb

## 2020-09-27 DIAGNOSIS — Z114 Encounter for screening for human immunodeficiency virus [HIV]: Secondary | ICD-10-CM

## 2020-09-27 DIAGNOSIS — Z0000 Encounter for general adult medical examination without abnormal findings: Secondary | ICD-10-CM | POA: Diagnosis not present

## 2020-09-29 ENCOUNTER — Encounter: Payer: Self-pay | Admitting: *Deleted

## 2020-09-29 ENCOUNTER — Other Ambulatory Visit: Payer: Self-pay

## 2020-09-29 ENCOUNTER — Ambulatory Visit (INDEPENDENT_AMBULATORY_CARE_PROVIDER_SITE_OTHER): Payer: 59 | Admitting: *Deleted

## 2020-09-29 DIAGNOSIS — Z23 Encounter for immunization: Secondary | ICD-10-CM | POA: Diagnosis not present

## 2020-09-29 NOTE — Progress Notes (Signed)
Per orders of Dr. Durene Cal, injection of Tdap 0.5 ml given IM by Corky Mull, LPN given in right deltoid. Patient tolerated injection well.

## 2020-11-29 DIAGNOSIS — D225 Melanocytic nevi of trunk: Secondary | ICD-10-CM | POA: Diagnosis not present

## 2020-11-29 DIAGNOSIS — D485 Neoplasm of uncertain behavior of skin: Secondary | ICD-10-CM | POA: Diagnosis not present

## 2020-11-29 DIAGNOSIS — L74510 Primary focal hyperhidrosis, axilla: Secondary | ICD-10-CM | POA: Diagnosis not present

## 2021-07-04 ENCOUNTER — Encounter: Payer: Self-pay | Admitting: Family Medicine

## 2021-09-29 ENCOUNTER — Encounter: Payer: Federal, State, Local not specified - PPO | Admitting: Family Medicine

## 2021-12-12 ENCOUNTER — Other Ambulatory Visit (HOSPITAL_COMMUNITY)
Admission: RE | Admit: 2021-12-12 | Discharge: 2021-12-12 | Disposition: A | Discharge status: Home / Self Care | Payer: Federal, State, Local not specified - PPO | Source: Ambulatory Visit | Attending: Cardiology | Admitting: Cardiology

## 2021-12-12 ENCOUNTER — Encounter: Payer: Self-pay | Admitting: Family Medicine

## 2021-12-12 ENCOUNTER — Ambulatory Visit: Payer: 59 | Admitting: Family Medicine

## 2021-12-12 ENCOUNTER — Ambulatory Visit (INDEPENDENT_AMBULATORY_CARE_PROVIDER_SITE_OTHER): Payer: 59 | Admitting: Family Medicine

## 2021-12-12 VITALS — BP 122/76 | HR 77 | Temp 99.7°F | Ht 71.0 in | Wt 163.0 lb

## 2021-12-12 DIAGNOSIS — Z113 Encounter for screening for infections with a predominantly sexual mode of transmission: Secondary | ICD-10-CM

## 2021-12-12 DIAGNOSIS — Z118 Encounter for screening for other infectious and parasitic diseases: Secondary | ICD-10-CM | POA: Diagnosis present

## 2021-12-12 DIAGNOSIS — Z114 Encounter for screening for human immunodeficiency virus [HIV]: Secondary | ICD-10-CM | POA: Diagnosis not present

## 2021-12-12 DIAGNOSIS — Z13220 Encounter for screening for lipoid disorders: Secondary | ICD-10-CM | POA: Diagnosis not present

## 2021-12-12 LAB — LIPID PANEL
Cholesterol: 193 mg/dL (ref 0–200)
HDL: 80.7 mg/dL (ref >39.00)
LDL Cholesterol: 99 mg/dL (ref 0–99)
NonHDL: 112.41
Total CHOL/HDL Ratio: 2
Triglycerides: 69 mg/dL (ref 0.0–149.0)
VLDL: 13.8 mg/dL (ref 0.0–40.0)

## 2021-12-12 NOTE — Progress Notes (Signed)
?  Phone [phone] ?In person visit ?  ?Subjective:  ? ?BYARD CARRANZA is a 26 y.o. year old very pleasant male patient who presents for/with See problem oriented charting ?Chief Complaint  ?Patient presents with  ? STD Testing  ?  Just want to get checked for STD's, no exposure ?  ? Immunizations  ?  Inquire about any needed vaccines, will come back to have them done at another time ?  ? ? ?Past Medical History-  ?Patient Active Problem List  ? Diagnosis Date Noted  ? Closed fracture of shaft of tibia 09/07/2017  ? Depressed mood 05/24/2014  ? ? ?Medications- reviewed and updated ?Current Outpatient Medications  ?Medication Sig Dispense Refill  ? Probiotic Product (PROBIOTIC PO) Take by mouth daily.    ? ?No current facility-administered medications for this visit.  ? ?  ?Objective:  ?BP 122/76   Pulse 77   Temp 99.7 ?F (37.6 ?C) (Temporal)   Ht 5' 11" (1.803 m)   Wt 163 lb (73.9 kg)   SpO2 97%   BMI 22.73 kg/m?  ?Gen: NAD, resting comfortably ?  ? ?Assessment and Plan  ? ?# screening hyperlipidemia ?S: Medication:none  ?Lab Results  ?Component Value Date  ? CHOL 169 02/26/2017  ? HDL 70.30 02/26/2017  ? [place] 76 02/26/2017  ? TRIG 112.0 02/26/2017  ? CHOLHDL 2 02/26/2017  ? A/P: update lipids as has been 5 years ? ?# RUQ pain- stopped drinking alcohol and resolved- thinks was gas. Offered CBC, CMP declined ? ?# STD screening ?S:no longer with long term GF from hs- they had unprotected sex but she was on IUD. New GF now also on birth control pills- she was recently tested for STDs and negative but he would like to be checked as well ?A/P: agree with STD screening-offered at [facility] but declined- update with labs today. To be on safe side encouraged to always have protected sex.  ? ?#HM- declines HPV vaccine for now, otherwise up to date ?Immunization History  ?Administered Date(s) Administered  ? DTaP 02/05/1997, 03/03/1997, 04/06/1997, 06/09/1997, 03/25/2001  ? Hepatitis A 02/22/2010, 03/30/2011  ? Hepatitis  B 12/04/1996, 01/04/1997, 06/09/1997  ? HiB (PRP-OMP) 02/05/1997, 04/06/1997, 03/03/1998  ? Influenza,inj,Quad PF,6+ Mos 05/30/2018  ? MMR 12/07/1997, 04/04/2001  ? Meningococcal B, OMV 11/01/2015, 12/06/2015  ? Meningococcal Conjugate 03/11/2014  ? Meningococcal Polysaccharide 02/22/2010  ? PFIZER(Purple Top)SARS-COV-2 Vaccination 02/25/2020, 03/18/2020  ? Td 02/22/2009  ? Tdap 02/22/2009, 09/29/2020  ? Varicella 12/07/1997, 02/22/2009  ? ?Recommended follow up: Return for next already scheduled visit or sooner if needed. ?Future Appointments  ?Date Time Provider [place]  ?05/15/2022  2:20 PM Shawndale Kilpatrick, Brayton Mars, MD [facility]  ? ?Lab/Order associations: ?  ICD-10-CM   ?1. Screening for HIV (human immunodeficiency virus)  Z11.4 HIV Antibody (routine testing w rflx)  ?  ?2. Screening examination for venereal disease  Z11.3 RPR  ?  ?3. Screening for gonorrhea  Z11.3 Urine cytology ancillary only  ?  ?4. Screening for chlamydial disease  Z11.8 Urine cytology ancillary only  ?  ?5. Screening for hyperlipidemia  Z13.220 Lipid panel  ?  ? ?Time Spent: ?12 minutes of total time (1:41 PM- 1:53 PM) was spent on the date of the encounter performing the following actions:obtaining history,  counseling on screening for STDs and immunizations,  placing orders, and documenting in our EHR.  ? ?Return precautions advised.  ?Garret Reddish, MD ? ?

## 2021-12-12 NOTE — Patient Instructions (Addendum)
Please stop by lab before you go If you have mychart- we will send your results within 3 business days of us receiving them.  If you do not have mychart- we will call you about results within 5 business days of us receiving them.  *please also note that you will see labs on mychart as soon as they post. I will later go in and write notes on them- will say "notes from Dr. Nadege Carriger"   Recommended follow up: Return for next already scheduled visit or sooner if needed. 

## 2021-12-13 LAB — RPR: RPR Ser Ql: NONREACTIVE

## 2021-12-13 LAB — URINE CYTOLOGY ANCILLARY ONLY
Chlamydia: NEGATIVE
Comment: NEGATIVE
Comment: NEGATIVE
Comment: NORMAL
Neisseria Gonorrhea: NEGATIVE
Trichomonas: NEGATIVE

## 2021-12-13 LAB — HIV ANTIBODY (ROUTINE TESTING W REFLEX): HIV 1&2 Ab, 4th Generation: NONREACTIVE

## 2022-04-30 ENCOUNTER — Encounter: Payer: Self-pay | Admitting: *Deleted

## 2022-05-15 ENCOUNTER — Encounter: Payer: 59 | Admitting: Family Medicine

## 2022-07-19 ENCOUNTER — Encounter: Payer: Self-pay | Admitting: *Deleted

## 2022-09-12 ENCOUNTER — Encounter: Payer: 59 | Admitting: Family Medicine

## 2022-10-03 ENCOUNTER — Encounter: Payer: Self-pay | Admitting: Family Medicine

## 2022-10-18 ENCOUNTER — Ambulatory Visit (INDEPENDENT_AMBULATORY_CARE_PROVIDER_SITE_OTHER): Payer: 59 | Admitting: Family Medicine

## 2022-10-18 ENCOUNTER — Encounter: Payer: Self-pay | Admitting: Family Medicine

## 2022-10-18 VITALS — BP 122/84 | HR 80 | Temp 98.6°F | Ht 71.0 in | Wt 167.2 lb

## 2022-10-18 DIAGNOSIS — F411 Generalized anxiety disorder: Secondary | ICD-10-CM

## 2022-10-18 MED ORDER — ESCITALOPRAM OXALATE 10 MG PO TABS: 10.0000 mg | ORAL_TABLET | Freq: Every day | ORAL | 5 refills | Status: DC

## 2022-10-18 MED ORDER — HYDROXYZINE HCL 25 MG PO TABS
25.0000 mg | ORAL_TABLET | Freq: Two times a day (BID) | ORAL | 1 refills | Status: AC | PRN
Start: 2022-10-18 — End: ?

## 2022-10-18 NOTE — Telephone Encounter (Signed)
Patient states: - After thinking he would like to trial a medication, specifically prozac or what ever PCP recommends
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
## 2022-10-18 NOTE — Telephone Encounter (Signed)
FYI
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
## 2022-10-18 NOTE — Addendum Note (Signed)
Addended by: Marin Olp on: 10/18/2022 05:17 PM   Modules accepted: Orders

## 2022-10-18 NOTE — Patient Instructions (Addendum)
Hydroxyzine as needed for more intense anxiety- but can make you sleep so do not drive after taking for 6-8 hours.   Please call [phone] to schedule a visit with [facility] - please tell the office you were directly referred by Dr. Allen Norris sometimes can be backed up for up to 2 months so here are some other options to check in on: - [email or web address]  at  [phone] [facility] [email or web address] at [phone]   IF you decide on lexapro: start with half tablet for 3 days then full tablet. Call me if you want me to send this in  Taking the medicine as directed and not missing any doses is one of the best things you can do to treat your anxiety.  Here are some things to keep in mind:  Side effects (stomach upset, some increased anxiety) may happen before you notice a benefit.  These side effects typically go away over time. Changes to your dose of medicine or a change in medication all together is sometimes necessary Most people need to be on medication at least 6-12 months Many people will notice an improvement within two weeks but the full effect of the medication can take up to 6 weeks Stopping the medication when you start feeling better often results in a return of symptoms If you start having thoughts of hurting yourself or others after starting this medicine, call our office immediately at [phone] or seek care through 911 or 988   Recommended follow up: Return in about 6 months (around 04/20/2023) for followup or sooner if needed.Schedule b4 you leave. -sooner if needed

## 2022-10-24 MED ORDER — FLUOXETINE HCL 10 MG PO CAPS: 10.0000 mg | ORAL_CAPSULE | Freq: Every day | ORAL | 3 refills | Status: AC

## 2022-10-29 NOTE — Telephone Encounter (Signed)
See below

## 2022-11-01 ENCOUNTER — Ambulatory Visit (INDEPENDENT_AMBULATORY_CARE_PROVIDER_SITE_OTHER): Payer: 59 | Admitting: Behavioral Health

## 2022-11-01 ENCOUNTER — Encounter: Payer: Self-pay | Admitting: Behavioral Health

## 2022-11-01 DIAGNOSIS — F4010 Social phobia, unspecified: Secondary | ICD-10-CM

## 2022-11-01 DIAGNOSIS — F410 Panic disorder [episodic paroxysmal anxiety] without agoraphobia: Secondary | ICD-10-CM

## 2022-11-01 DIAGNOSIS — F411 Generalized anxiety disorder: Secondary | ICD-10-CM | POA: Diagnosis not present
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
## 2022-11-06 ENCOUNTER — Other Ambulatory Visit: Payer: Self-pay

## 2022-11-06 DIAGNOSIS — F411 Generalized anxiety disorder: Secondary | ICD-10-CM

## 2022-11-06 DIAGNOSIS — F410 Panic disorder [episodic paroxysmal anxiety] without agoraphobia: Secondary | ICD-10-CM

## 2022-11-08 ENCOUNTER — Ambulatory Visit: Payer: Federal, State, Local not specified - PPO | Admitting: Behavioral Health

## 2022-11-14 ENCOUNTER — Ambulatory Visit (INDEPENDENT_AMBULATORY_CARE_PROVIDER_SITE_OTHER): Payer: Federal, State, Local not specified - PPO | Admitting: Behavioral Health

## 2022-11-14 ENCOUNTER — Encounter: Payer: Self-pay | Admitting: Behavioral Health

## 2022-11-14 DIAGNOSIS — F4010 Social phobia, unspecified: Secondary | ICD-10-CM

## 2022-11-14 DIAGNOSIS — F411 Generalized anxiety disorder: Secondary | ICD-10-CM | POA: Diagnosis not present

## 2022-11-14 NOTE — Progress Notes (Signed)
                Stephen Martinez Stephen Martinez, LCMHC 

## 2022-11-14 NOTE — Progress Notes (Signed)
[facility] Counselor/Therapist Progress Note  Patient ID: Stephen Martinez, MRN: [id number],    Date: 11/14/2022  Time Spent: 59 minutes via video session.  The patient was at home and this therapist was in his home office.  Treatment Type: Individual Therapy  Reported Symptoms: anxiety  Mental Status Exam: Appearance:  Well Groomed     Behavior: Appropriate  Motor: Normal  Speech/Language:  Clear and Coherent  Affect: Appropriate  Mood: normal  Thought process: normal  Thought content:   WNL  Sensory/Perceptual disturbances:   WNL  Orientation: oriented to person, place, time/date, situation, day of week, and month of year  Attention: Good  Concentration: Good  Memory: WNL  Fund of knowledge:  Good  Insight:   Good  Judgment:  Good  Impulse Control: Good   Risk Assessment: Danger to Self:  No Self-injurious Behavior: No Danger to Others: No Duty to Warn:no Physical Aggression / Violence:No  Access to Firearms a concern: No  Gang Involvement:No   Subjective: The patient has made progress since our last session.  He started off by having someone ride with him and there was some anxiety in a couple of different circumstances.  One in particular was difficult but did not escalate into a full-blown panic attack.  His father was with him so that patient pulled off because he felt numbness starting in his arms and he let his father drive.  When he got in the passenger seat it did worsen at least temporarily but when he got to his apartment for the most part it was gone.  He has been using breathing to help those situations as well as the tips skill.  I included progressive muscle relaxation in today's session for him to practice.  He also notes a decrease in anxiety since coming off medication and has been off everything totally now for a couple of days.  He has not had a full-blown panic attack since the time of the truck where he had 6 or 7 in a short amount of time.   He also found out that his girlfriend will be attending a couple university so they can stay in the area and he has started looking for places for them to live.  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  He reports some anticipatory anxiety especially in thinking about some upcoming trips, specifically the one to [facility] on the 26th of this month to see his girlfriend.  We talked about compartmentalization both physically and mentally.  We looked strongly at cognitive reframing and challenging which she is already doing very well.  I reminded him to remind himself that he always can stop the truck and get out but to practice using coping skills and anticipation of going somewhere.  He also was going to further some gradual exposure therapy by trying to drive a mile or so down the road from his house over the next couple of days knowing his mom can pick him up if he gets in trouble.  Interventions: Cognitive Behavioral Therapy and Dialectical Behavioral Therapy  Diagnosis: Generalized anxiety disorder with panic attacks, social anxiety disorder  Plan: I will meet with the patient every 2 weeks via video. Treatment plan: To use cognitive behavioral therapy as well as elements of dialectical behavior therapy to reduce the patient's anxiety by at least 50%  over the next 3 months with a target date of February 03, 2023.  Goals will be to help him improve his ability to better manage anxiety symptoms and stress, identify causes for anxiety and explore ways to reduce it, resolve the core conflicts contributing to anxiety, and help him manage thoughts and worrisome thinking contributing to feelings of anxiety.  Interventions will include providing education about anxiety, facilitate problem solution skills to help him implement options for resolving stress, teach coping skills for  managing anxiety, use cognitive behavioral therapy to identify and change anxiety provoking thoughts and behavior patterns, and use DBT distress tolerance and mindfulness skills to help reduce anxiety. [name], [facility]

## 2022-11-22 ENCOUNTER — Encounter: Payer: Self-pay | Admitting: Behavioral Health

## 2022-11-22 ENCOUNTER — Ambulatory Visit (INDEPENDENT_AMBULATORY_CARE_PROVIDER_SITE_OTHER): Payer: Federal, State, Local not specified - PPO | Admitting: Behavioral Health

## 2022-11-22 DIAGNOSIS — F4010 Social phobia, unspecified: Secondary | ICD-10-CM

## 2022-11-22 DIAGNOSIS — F411 Generalized anxiety disorder: Secondary | ICD-10-CM
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
## 2022-11-28 ENCOUNTER — Ambulatory Visit (INDEPENDENT_AMBULATORY_CARE_PROVIDER_SITE_OTHER): Payer: Federal, State, Local not specified - PPO | Admitting: Behavioral Health

## 2022-11-28 ENCOUNTER — Encounter: Payer: Self-pay | Admitting: Behavioral Health

## 2022-11-28 DIAGNOSIS — F411 Generalized anxiety disorder: Secondary | ICD-10-CM

## 2022-11-28 DIAGNOSIS — F4010 Social phobia, unspecified: Secondary | ICD-10-CM | POA: Diagnosis not present
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
## 2022-12-06 ENCOUNTER — Ambulatory Visit: Payer: Federal, State, Local not specified - PPO | Admitting: Behavioral Health

## 2023-03-21 ENCOUNTER — Encounter (INDEPENDENT_AMBULATORY_CARE_PROVIDER_SITE_OTHER): Payer: Self-pay

## 2023-04-22 ENCOUNTER — Ambulatory Visit: Payer: Medicaid Other | Admitting: Family Medicine
